# Patient Record
Sex: Male | Born: 1975 | Race: White | Hispanic: No | Marital: Married | Smoking: Never smoker
Health system: Southern US, Community
[De-identification: ages and names within clinical notes are randomized; demographics above are authoritative.]

## PROBLEM LIST (undated history)

## (undated) DIAGNOSIS — F909 Attention-deficit hyperactivity disorder, unspecified type: Secondary | ICD-10-CM

## (undated) HISTORY — PX: LAPAROSCOPIC INGUINAL HERNIA REPAIR: SUR788

---

## 2014-10-05 ENCOUNTER — Other Ambulatory Visit: Payer: Self-pay | Admitting: Neurology

## 2014-10-05 DIAGNOSIS — R200 Anesthesia of skin: Secondary | ICD-10-CM

## 2014-10-05 DIAGNOSIS — R202 Paresthesia of skin: Principal | ICD-10-CM

## 2014-10-09 ENCOUNTER — Ambulatory Visit
Admission: RE | Admit: 2014-10-09 | Discharge: 2014-10-09 | Disposition: A | Discharge status: Home / Self Care | Payer: 59 | Source: Ambulatory Visit | Attending: Neurology | Admitting: Neurology

## 2014-10-09 DIAGNOSIS — R200 Anesthesia of skin: Secondary | ICD-10-CM | POA: Insufficient documentation

## 2014-10-09 DIAGNOSIS — R202 Paresthesia of skin: Secondary | ICD-10-CM | POA: Diagnosis present

## 2014-10-09 MED ORDER — GADOBENATE DIMEGLUMINE 529 MG/ML IV SOLN
15.0000 mL | Freq: Once | INTRAVENOUS | Status: AC | PRN
  Administered 2014-10-09: 14 mL via INTRAVENOUS

## 2016-05-09 IMAGING — MR MR HEAD WO/W CM
12 of 13 series · 42 of 48 positions shown · IV contrast (multihance)
Comparison: None.

CLINICAL DATA: Head trauma 2 years ago. No surgery. LEFT-sided lip
numbness. Symptoms for 3 weeks.

EXAM:
MRI HEAD WITHOUT AND WITH CONTRAST
TECHNIQUE: Multiplanar, multiecho pulse sequences of the brain and surrounding
structures were obtained without and with intravenous contrast.
CONTRAST:  14mL MULTIHANCE GADOBENATE DIMEGLUMINE 529 MG/ML IV SOLN

[Series 2: T1 · sagittal · 5.0mm · 0.45mm/px · 2 of 28 slices shown]
[im 1/28]
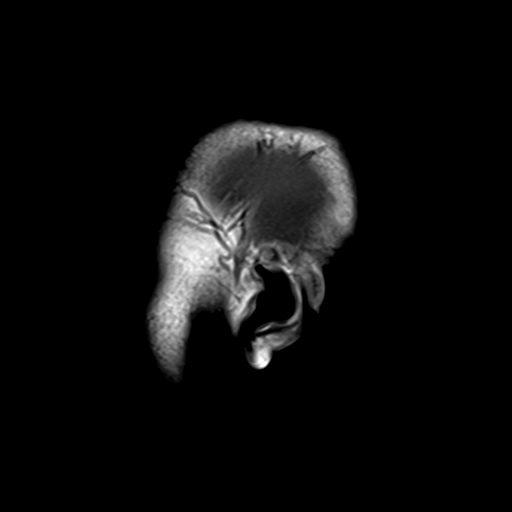
[im 14/28]
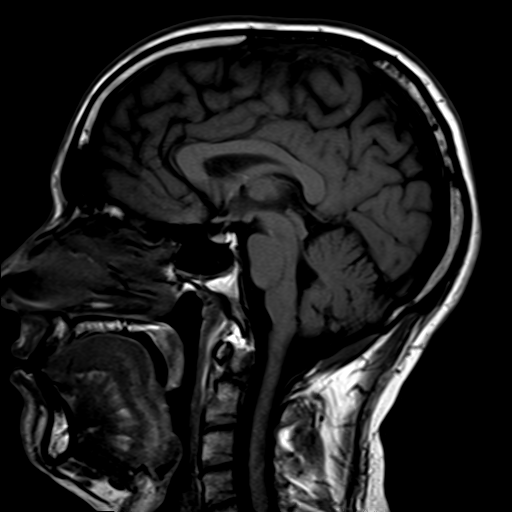

[Series 4: DWI · axial · 3.0mm · 1.80mm/px · z∈[-41,+120]mm · 5 of 56 slices shown (1 of 4)]
[im 1/56]
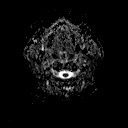
[im 14/56]
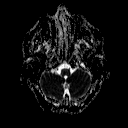
[im 28/56]
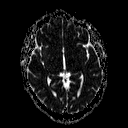
[im 42/56]
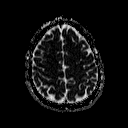
[im 56/56]
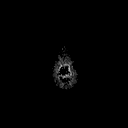

[Series 6: DWI · coronal · 3.0mm · 1.80mm/px · 4 of 47 slices shown (2 of 4)]
[im 1/47]
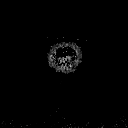
[im 16/47]
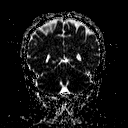
[im 31/47]
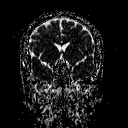
[im 47/47]
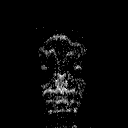

[Series 7: T2 · axial · 5.0mm · 0.60mm/px · z∈[-51,+117]mm · 3 of 27 slices shown (1 of 2)]
[im 1/27]
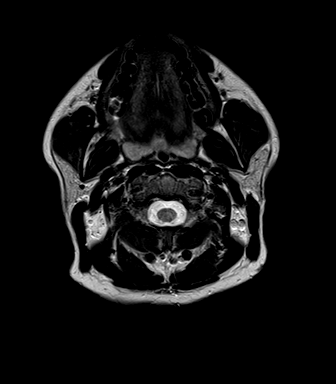
[im 14/27]
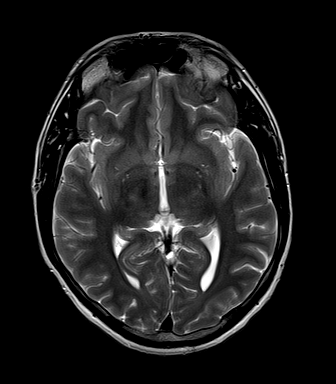
[im 27/27]
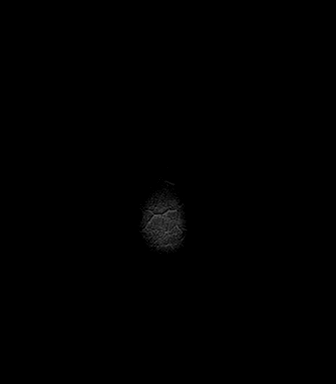

[Series 8: FLAIR · sagittal · 5.0mm · 0.43mm/px · 2 of 25 slices shown (1 of 2)]
[im 1/25]
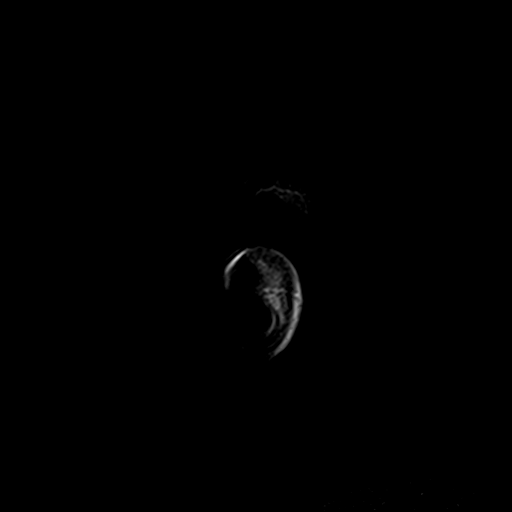
[im 25/25]
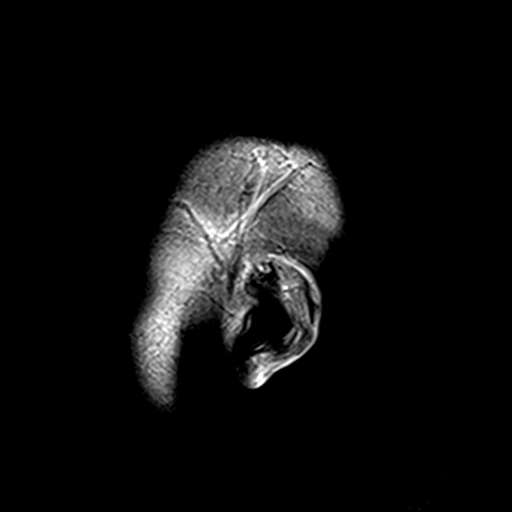

[Series 9: FLAIR · axial · 5.0mm · 0.45mm/px · z∈[-50,+117]mm · 3 of 27 slices shown (2 of 2)]
[im 1/27]
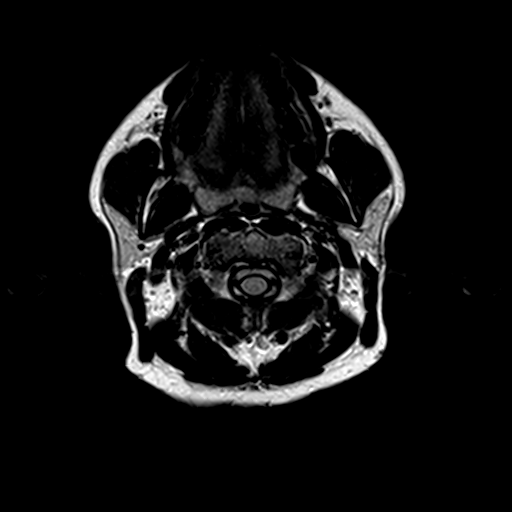
[im 14/27]
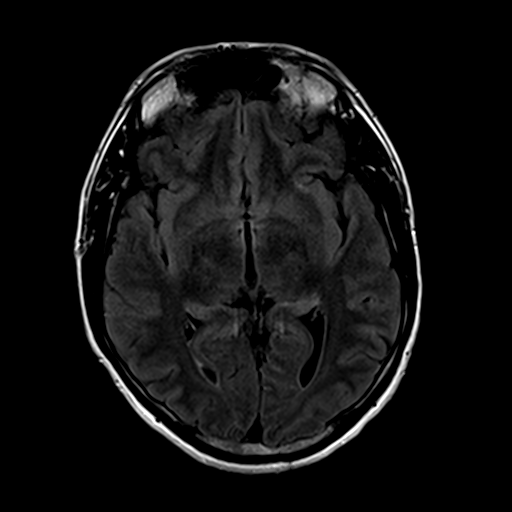
[im 27/27]
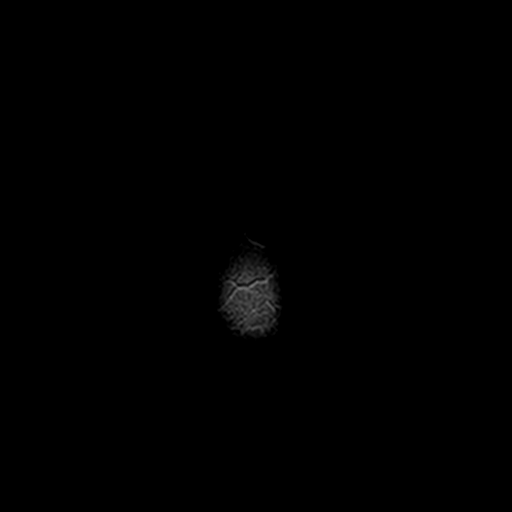

[Series 10: T2 · axial · 5.0mm · 0.45mm/px · z∈[-51,+117]mm · 3 of 27 slices shown (2 of 2)]
[im 1/27]
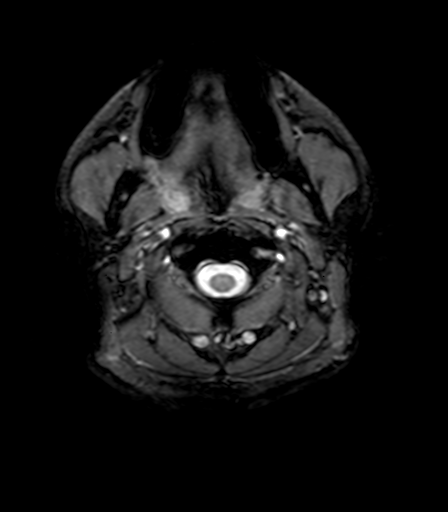
[im 14/27]
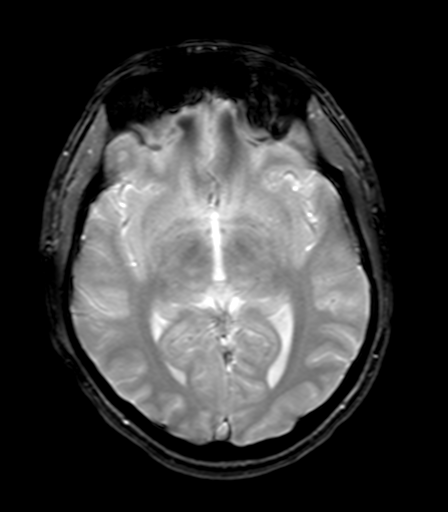
[im 27/27]
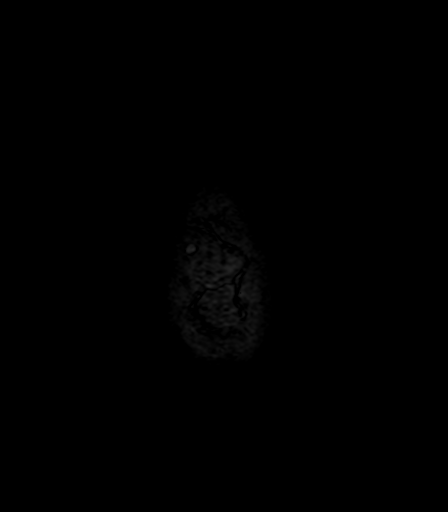

[Series 12: T2 post-contrast · coronal · 5.0mm · 0.49mm/px · 3 of 29 slices shown]
[im 1/29]
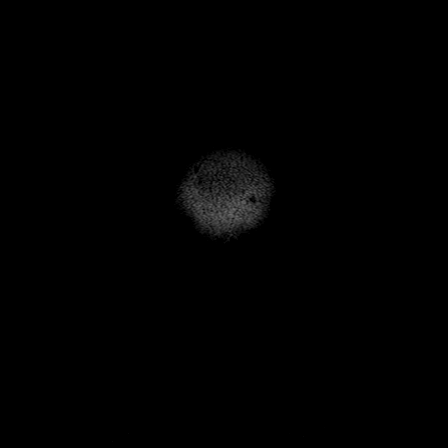
[im 15/29]
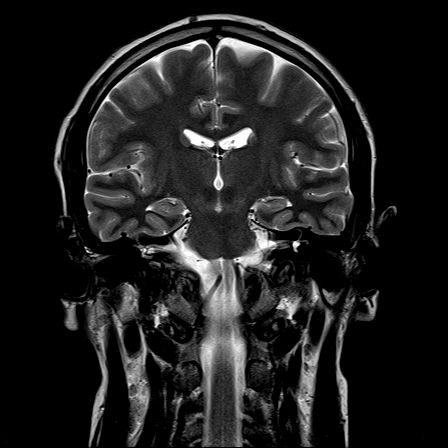
[im 29/29]
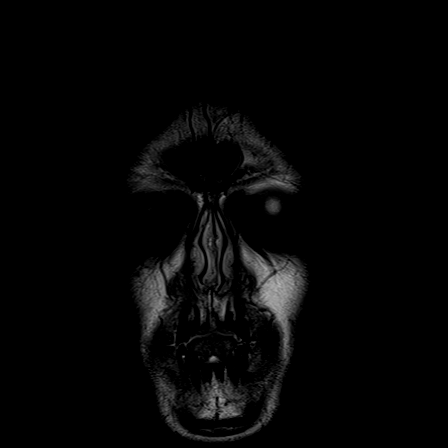

[Series 13: T1 post-contrast · axial · 3.0mm · 1.00mm/px · z∈[-47,+117]mm · 5 of 56 slices shown (1 of 2)]
[im 1/56]
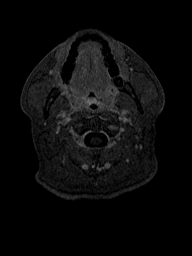
[im 14/56]
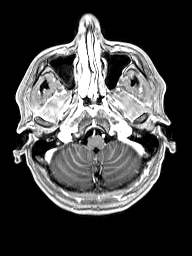
[im 28/56]
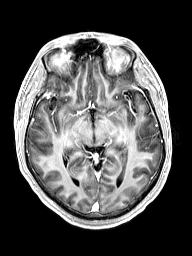
[im 42/56]
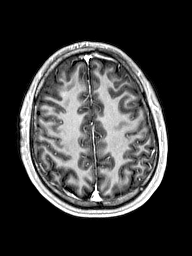
[im 56/56]
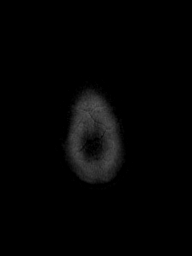

[Series 14: T1 post-contrast · coronal · 5.0mm · 0.43mm/px · 3 of 29 slices shown (2 of 2)]
[im 1/29]
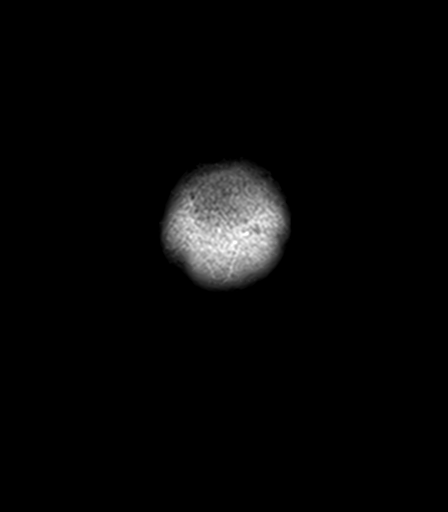
[im 15/29]
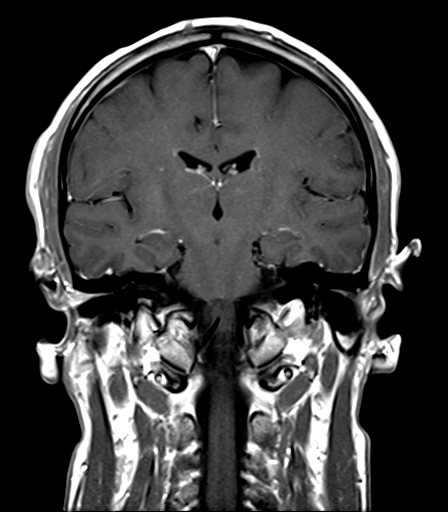
[im 29/29]
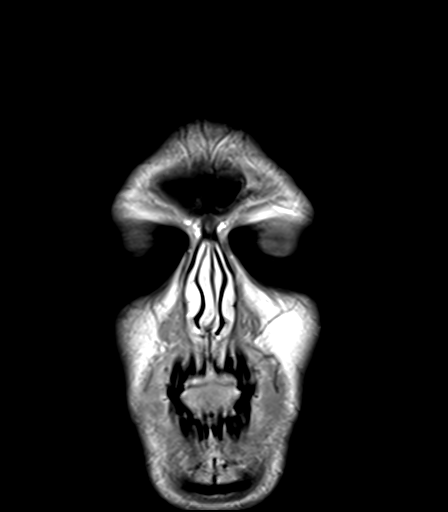

[Series 100: DWI · axial · 3.0mm · 1.80mm/px · z∈[-41,+120]mm · 5 of 56 slices shown (3 of 4)]
[im 1/56]
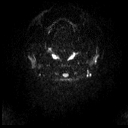
[im 14/56]
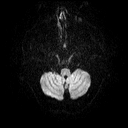
[im 28/56]
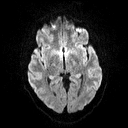
[im 42/56]
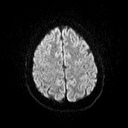
[im 56/56]
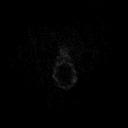

[Series 101: DWI · coronal · 3.0mm · 1.80mm/px · 4 of 47 slices shown (4 of 4)]
[im 1/47]
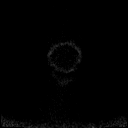
[im 16/47]
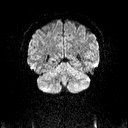
[im 31/47]
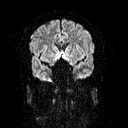
[im 47/47]
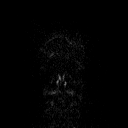

[42 of 48 positions shown; findings below may reference images not displayed]

FINDINGS: No evidence for acute infarction, hemorrhage, mass lesion,
hydrocephalus, or extra-axial fluid. Normal cerebral volume. No
significant white matter disease. Sagittal FLAIR images demonstrate
normal corpus callosum and periventricular regions.

Pituitary, pineal, and cerebellar tonsils unremarkable. No upper
cervical lesions.

Flow voids are maintained throughout the carotid, basilar, and
vertebral arteries. There are no areas of chronic hemorrhage.

Post infusion, no abnormal enhancement of the brain or meninges.

Visualized calvarium, skull base, and upper cervical osseous
structures unremarkable. Scalp and extracranial soft tissues,
orbits, sinuses, and mastoids show no acute process.
IMPRESSION: Negative exam.

## 2016-09-24 ENCOUNTER — Emergency Department

## 2016-09-24 ENCOUNTER — Encounter: Payer: Self-pay | Admitting: Emergency Medicine

## 2016-09-24 ENCOUNTER — Inpatient Hospital Stay
Admission: EM | Admit: 2016-09-24 | Discharge: 2016-09-26 | DRG: 871 | Disposition: A | Discharge status: Home / Self Care | Attending: Internal Medicine | Admitting: Internal Medicine

## 2016-09-24 DIAGNOSIS — E0781 Sick-euthyroid syndrome: Secondary | ICD-10-CM

## 2016-09-24 DIAGNOSIS — E663 Overweight: Secondary | ICD-10-CM | POA: Diagnosis present

## 2016-09-24 DIAGNOSIS — R531 Weakness: Secondary | ICD-10-CM | POA: Diagnosis not present

## 2016-09-24 DIAGNOSIS — A419 Sepsis, unspecified organism: Secondary | ICD-10-CM | POA: Diagnosis not present

## 2016-09-24 DIAGNOSIS — R739 Hyperglycemia, unspecified: Secondary | ICD-10-CM

## 2016-09-24 DIAGNOSIS — R0902 Hypoxemia: Secondary | ICD-10-CM | POA: Diagnosis present

## 2016-09-24 DIAGNOSIS — J441 Chronic obstructive pulmonary disease with (acute) exacerbation: Secondary | ICD-10-CM | POA: Diagnosis present

## 2016-09-24 DIAGNOSIS — J440 Chronic obstructive pulmonary disease with acute lower respiratory infection: Secondary | ICD-10-CM | POA: Diagnosis present

## 2016-09-24 DIAGNOSIS — F909 Attention-deficit hyperactivity disorder, unspecified type: Secondary | ICD-10-CM | POA: Diagnosis present

## 2016-09-24 DIAGNOSIS — Z79899 Other long term (current) drug therapy: Secondary | ICD-10-CM | POA: Diagnosis not present

## 2016-09-24 DIAGNOSIS — J189 Pneumonia, unspecified organism: Secondary | ICD-10-CM

## 2016-09-24 DIAGNOSIS — Z6826 Body mass index (BMI) 26.0-26.9, adult: Secondary | ICD-10-CM

## 2016-09-24 DIAGNOSIS — D72829 Elevated white blood cell count, unspecified: Secondary | ICD-10-CM

## 2016-09-24 DIAGNOSIS — R6510 Systemic inflammatory response syndrome (SIRS) of non-infectious origin without acute organ dysfunction: Secondary | ICD-10-CM

## 2016-09-24 DIAGNOSIS — J9601 Acute respiratory failure with hypoxia: Secondary | ICD-10-CM

## 2016-09-24 HISTORY — DX: Attention-deficit hyperactivity disorder, unspecified type: F90.9

## 2016-09-24 LAB — BASIC METABOLIC PANEL
Anion gap: 6 (ref 5–15)
BUN: 14 mg/dL (ref 6–20)
CHLORIDE: 108 mmol/L (ref 101–111)
CO2: 24 mmol/L (ref 22–32)
CREATININE: 0.95 mg/dL (ref 0.61–1.24)
Calcium: 8.9 mg/dL (ref 8.9–10.3)
GFR calc non Af Amer: >60 mL/min (ref >60)
Glucose, Bld: 114 mg/dL — ABNORMAL HIGH (ref 65–99)
POTASSIUM: 4 mmol/L (ref 3.5–5.1)
SODIUM: 138 mmol/L (ref 135–145)

## 2016-09-24 LAB — URINALYSIS, COMPLETE (UACMP) WITH MICROSCOPIC
Bilirubin Urine: NEGATIVE
Glucose, UA: NEGATIVE mg/dL
Hgb urine dipstick: NEGATIVE
KETONES UR: NEGATIVE mg/dL
LEUKOCYTES UA: NEGATIVE
Nitrite: NEGATIVE
PROTEIN: NEGATIVE mg/dL
SQUAMOUS EPITHELIAL / LPF: NONE SEEN
Specific Gravity, Urine: 1.013 (ref 1.005–1.030)
pH: 7 (ref 5.0–8.0)

## 2016-09-24 LAB — CK: Total CK: 105 U/L (ref 49–397)

## 2016-09-24 LAB — CBC
HCT: 37.8 % — ABNORMAL LOW (ref 40.0–52.0)
Hemoglobin: 13 g/dL (ref 13.0–18.0)
MCH: 29.4 pg (ref 26.0–34.0)
MCHC: 34.4 g/dL (ref 32.0–36.0)
MCV: 85.3 fL (ref 80.0–100.0)
Platelets: 221 K/uL (ref 150–440)
RBC: 4.43 MIL/uL (ref 4.40–5.90)
RDW: 13.4 % (ref 11.5–14.5)
WBC: 18.2 K/uL — ABNORMAL HIGH (ref 3.8–10.6)

## 2016-09-24 LAB — TROPONIN I

## 2016-09-24 LAB — GLUCOSE, CAPILLARY: Glucose-Capillary: 103 mg/dL — ABNORMAL HIGH (ref 65–99)

## 2016-09-24 LAB — LACTIC ACID, PLASMA: Lactic Acid, Venous: 1 mmol/L (ref 0.5–1.9)

## 2016-09-24 MED ORDER — SODIUM CHLORIDE 0.9 % IV BOLUS (SEPSIS)
1000.0000 mL | Freq: Once | INTRAVENOUS | Status: AC
  Administered 2016-09-24: 1000 mL via INTRAVENOUS

## 2016-09-24 MED ORDER — ACETAMINOPHEN 500 MG PO TABS
1000.0000 mg | ORAL_TABLET | Freq: Once | ORAL | Status: AC
  Administered 2016-09-24: 1000 mg via ORAL

## 2016-09-24 MED ORDER — DEXTROSE 5 % IV SOLN
500.0000 mg | Freq: Once | INTRAVENOUS | Status: AC
  Administered 2016-09-25: 500 mg via INTRAVENOUS
  Filled 2016-09-24: qty 500

## 2016-09-24 MED ORDER — DEXTROSE 5 % IV SOLN
1.0000 g | INTRAVENOUS | Status: DC
  Administered 2016-09-25: 1 g via INTRAVENOUS
  Filled 2016-09-24: qty 10

## 2016-09-24 MED ORDER — ACETAMINOPHEN 500 MG PO TABS
ORAL_TABLET | ORAL | Status: AC
  Filled 2016-09-24: qty 2

## 2016-09-24 NOTE — ED Triage Notes (Addendum)
Pt to triage via wheelchair, reports weakness, fatigue, dizziness, oliguria, polydipsia, generalized pain, chills, and cough since last night.  Low grade temp noted in triage.

## 2016-09-24 NOTE — ED Provider Notes (Signed)
[facility] Emergency Department Provider Note   ____________________________________________   First MD Initiated Contact with Patient 09/24/16 2315     (approximate)  I have reviewed the triage vital signs and the nursing notes.   HISTORY  Chief Complaint Weakness and Generalized Body Aches    HPI Arvell Pulsifer is a 41 y.o. male who presents to the ED from home with a chief complaint of generalized weakness, fatigue, cough, chills, dizziness. Patient reports generalized weakness and fatigue for the past several months. Sometimes associated with polydipsia and oliguria. Since last evening, patient has had chills, subjective fever and nonproductive cough.Denies associated abdominal pain, nausea, vomiting, dysuria or diarrhea. Denies recent travel or trauma. Nothing makes his symptoms better or worse. Has not taken anything prior to arrival.   Past medical history None  There are no active problems to display for this patient.   History reviewed. No pertinent surgical history.  Prior to Admission medications   Medication Sig Start Date End Date Taking? Authorizing Provider  amphetamine-dextroamphetamine (ADDERALL XR) 20 MG 24 hr capsule Take 1 capsule by mouth every morning. 09/14/16  Yes [provider]  amphetamine-dextroamphetamine (ADDERALL) 10 MG tablet Take 1 tablet by mouth daily. In the afternoon 09/14/16  Yes [provider]  Multiple Vitamin (MULTI-VITAMINS) TABS Take 1 tablet by mouth daily.   Yes [provider]  Omega-3 1000 MG CAPS Take 1 capsule by mouth daily.   Yes [provider]    Allergies Patient has no known allergies.  History reviewed. No pertinent family history.  Social History Social History  Substance Use Topics  . Smoking status: Never Smoker  . Smokeless tobacco: Never Used  . Alcohol use Yes    Review of Systems  Constitutional: Positive for fever/chills. Positive for  generalized weakness and myalgias. Eyes: No visual changes. ENT: No sore throat. Cardiovascular: Denies chest pain. Respiratory: Positive for nonproductive cough. Denies shortness of breath. Gastrointestinal: No abdominal pain.  No nausea, no vomiting.  No diarrhea.  No constipation. Genitourinary: Negative for dysuria. Musculoskeletal: Negative for back pain. Skin: Negative for rash. Neurological: Negative for headaches, focal weakness or numbness.   ____________________________________________   PHYSICAL EXAM:  VITAL SIGNS: ED Triage Vitals  Enc Vitals Group     BP 09/24/16 2216 (!) 102/59     Pulse Rate 09/24/16 2216 (!) 106     Resp 09/24/16 2216 16     Temp 09/24/16 2216 100 F (37.8 C)     Temp Source 09/24/16 2216 Oral     SpO2 09/24/16 2216 94 %     Weight 09/24/16 2216 150 lb (68 kg)     Height 09/24/16 2216 5\' 5"  (1.651 m)     Head Circumference --      Peak Flow --      Pain Score 09/24/16 2215 7     Pain Loc --      Pain Edu? --      Excl. in GC? --     Constitutional: Alert and oriented. Well appearing and in mild acute distress. Eyes: Conjunctivae are normal. PERRL. EOMI. Head: Atraumatic. Nose: Congestion/rhinnorhea. Mouth/Throat: Mucous membranes are moist.  Oropharynx non-erythematous. Neck: No stridor.  Supple neck without meningismus.  No carotid bruits. Hematological/Lymphatic/Immunilogical: No cervical lymphadenopathy. Cardiovascular: Normal rate, regular rhythm. Grossly normal heart sounds.  Good peripheral circulation. Respiratory: Normal respiratory effort.  No retractions. Lungs with bibasilar rales. Gastrointestinal: Soft and nontender. No distention. No abdominal bruits. No CVA tenderness. Musculoskeletal:  No lower extremity tenderness nor edema.  No joint effusions. Neurologic:  Normal speech and language. No gross focal neurologic deficits are appreciated.  Skin:  Skin is warm, dry and intact. No rash noted. No petechiae. Psychiatric:  Mood and affect are normal. Speech and behavior are normal.  ____________________________________________   LABS (all labs ordered are listed, but only abnormal results are displayed)  Labs Reviewed  BASIC METABOLIC PANEL - Abnormal; Notable for the following:       Result Value   Glucose, Bld 114 (*)    All other components within normal limits  CBC - Abnormal; Notable for the following:    WBC 18.2 (*)    HCT 37.8 (*)    All other components within normal limits  URINALYSIS, COMPLETE (UACMP) WITH MICROSCOPIC - Abnormal; Notable for the following:    Color, Urine YELLOW (*)    APPearance CLEAR (*)    Bacteria, UA RARE (*)    All other components within normal limits  GLUCOSE, CAPILLARY - Abnormal; Notable for the following:    Glucose-Capillary 103 (*)    All other components within normal limits  T4, FREE - Abnormal; Notable for the following:    Free T4 0.59 (*)    All other components within normal limits  CULTURE, BLOOD (ROUTINE X 2)  CULTURE, BLOOD (ROUTINE X 2)  TROPONIN I  LACTIC ACID, PLASMA  CK  TSH  MONONUCLEOSIS SCREEN  LACTIC ACID, PLASMA  CBG MONITORING, ED   ____________________________________________  EKG  ED ECG REPORT I, Gianluca Chhim J, the attending physician, personally viewed and interpreted this ECG.   Date: 09/24/2016  EKG Time: 2215  Rate: 95  Rhythm: normal EKG, normal sinus rhythm  Axis: Normal  Intervals:none  ST&T Change: Nonspecific  ____________________________________________  RADIOLOGY  Dg Chest Port 1 View  Result Date: 09/24/2016 CLINICAL DATA:  Cough and chills. EXAM: PORTABLE CHEST 1 VIEW COMPARISON:  None. FINDINGS: Low lung volumes. There are patchy bibasilar opacities, left greater than right. Heart size is normal for technique. Questionable blunting of right costophrenic angle. No pneumothorax. No acute osseous abnormality is seen. IMPRESSION: Low lung volumes. Patchy bibasilar opacities, left greater than right, may be  pneumonia or atelectasis. Aspiration could have this appearance in the appropriate clinical setting. Electronically Signed   By: Rubye OaksMelanie  Ehinger M.D.   On: 09/24/2016 23:42    ____________________________________________   PROCEDURES  Procedure(s) performed: None  Procedures  Critical Care performed: No  ____________________________________________   INITIAL IMPRESSION / ASSESSMENT AND PLAN / ED COURSE  Pertinent labs & imaging results that were available during my care of the patient were reviewed by me and considered in my medical decision making (see chart for details).  41 year old male who presents with generalized weakness, fever, chills and cough. Laboratory and imaging results demonstrate leukocytosis with by lateral community-acquired pneumonia. Lactate is within normal limits. Will initiate IV fluid resuscitation, IV antibiotics. Check mono screen and thyroid panel. Will reassess.  Clinical Course as of Sep 26 215  Fri Sep 25, 2016  0153 Room air sats 89-90%, placed on 2 L nasal cannula oxygen with sats now 90-93%. Still weak appearing. IV antibiotics infusing. Will discuss with hospitalist to evaluate patient in the emergency department for admission.  [JS]    Clinical Course User Index [JS] Irean HongSung, Devonna Oboyle J, MD     ____________________________________________   FINAL CLINICAL IMPRESSION(S) / ED DIAGNOSES  Final diagnoses:  Generalized weakness  Community acquired pneumonia, unspecified laterality  Hypoxia  SIRS (  systemic inflammatory response syndrome) (HCC)      NEW MEDICATIONS STARTED DURING THIS VISIT:  New Prescriptions   No medications on file     Note:  This document was prepared using Dragon voice recognition software and may include unintentional dictation errors.    Irean Hong, MD 09/25/16 [phone]

## 2016-09-25 DIAGNOSIS — R531 Weakness: Secondary | ICD-10-CM | POA: Diagnosis present

## 2016-09-25 DIAGNOSIS — E0781 Sick-euthyroid syndrome: Secondary | ICD-10-CM | POA: Diagnosis present

## 2016-09-25 DIAGNOSIS — R739 Hyperglycemia, unspecified: Secondary | ICD-10-CM | POA: Diagnosis present

## 2016-09-25 DIAGNOSIS — F909 Attention-deficit hyperactivity disorder, unspecified type: Secondary | ICD-10-CM | POA: Diagnosis present

## 2016-09-25 DIAGNOSIS — A419 Sepsis, unspecified organism: Secondary | ICD-10-CM | POA: Diagnosis present

## 2016-09-25 DIAGNOSIS — E663 Overweight: Secondary | ICD-10-CM | POA: Diagnosis present

## 2016-09-25 DIAGNOSIS — Z79899 Other long term (current) drug therapy: Secondary | ICD-10-CM | POA: Diagnosis not present

## 2016-09-25 DIAGNOSIS — J9601 Acute respiratory failure with hypoxia: Secondary | ICD-10-CM | POA: Diagnosis present

## 2016-09-25 DIAGNOSIS — J441 Chronic obstructive pulmonary disease with (acute) exacerbation: Secondary | ICD-10-CM | POA: Diagnosis present

## 2016-09-25 DIAGNOSIS — Z6826 Body mass index (BMI) 26.0-26.9, adult: Secondary | ICD-10-CM | POA: Diagnosis not present

## 2016-09-25 DIAGNOSIS — R0902 Hypoxemia: Secondary | ICD-10-CM | POA: Diagnosis present

## 2016-09-25 DIAGNOSIS — J440 Chronic obstructive pulmonary disease with acute lower respiratory infection: Secondary | ICD-10-CM | POA: Diagnosis present

## 2016-09-25 DIAGNOSIS — J189 Pneumonia, unspecified organism: Secondary | ICD-10-CM | POA: Diagnosis present

## 2016-09-25 LAB — T4, FREE: Free T4: 0.59 ng/dL — ABNORMAL LOW (ref 0.61–1.12)

## 2016-09-25 LAB — TSH: TSH: 3.546 u[IU]/mL (ref 0.350–4.500)

## 2016-09-25 LAB — HEMOGLOBIN A1C
Hgb A1c MFr Bld: 5.3 % (ref 4.8–5.6)
Mean Plasma Glucose: 105.41 mg/dL

## 2016-09-25 LAB — LACTIC ACID, PLASMA: Lactic Acid, Venous: 1.1 mmol/L (ref 0.5–1.9)

## 2016-09-25 LAB — MONONUCLEOSIS SCREEN: MONO SCREEN: NEGATIVE

## 2016-09-25 MED ORDER — OMEGA-3-ACID ETHYL ESTERS 1 G PO CAPS
1.0000 g | ORAL_CAPSULE | Freq: Every day | ORAL | Status: DC
  Administered 2016-09-25 – 2016-09-26 (×2): 1 g via ORAL
  Filled 2016-09-25 (×2): qty 1

## 2016-09-25 MED ORDER — SODIUM CHLORIDE 0.9 % IV SOLN
INTRAVENOUS | Status: DC
  Administered 2016-09-25 – 2016-09-26 (×3): via INTRAVENOUS

## 2016-09-25 MED ORDER — ONDANSETRON HCL 4 MG PO TABS: 4.0000 mg | ORAL_TABLET | Freq: Four times a day (QID) | ORAL | Status: DC | PRN

## 2016-09-25 MED ORDER — ACETAMINOPHEN 650 MG RE SUPP: 650.0000 mg | Freq: Four times a day (QID) | RECTAL | Status: DC | PRN

## 2016-09-25 MED ORDER — AZITHROMYCIN 500 MG IV SOLR
500.0000 mg | INTRAVENOUS | Status: DC
  Administered 2016-09-25: 500 mg via INTRAVENOUS
  Filled 2016-09-25 (×2): qty 500

## 2016-09-25 MED ORDER — HYDROCOD POLST-CPM POLST ER 10-8 MG/5ML PO SUER
5.0000 mL | Freq: Two times a day (BID) | ORAL | Status: DC
  Administered 2016-09-25 – 2016-09-26 (×3): 5 mL via ORAL
  Filled 2016-09-25 (×3): qty 5

## 2016-09-25 MED ORDER — ENOXAPARIN SODIUM 40 MG/0.4ML ~~LOC~~ SOLN
40.0000 mg | SUBCUTANEOUS | Status: DC
  Administered 2016-09-25 – 2016-09-26 (×2): 40 mg via SUBCUTANEOUS
  Filled 2016-09-25 (×2): qty 0.4

## 2016-09-25 MED ORDER — ONDANSETRON HCL 4 MG/2ML IJ SOLN: 4.0000 mg | Freq: Four times a day (QID) | INTRAMUSCULAR | Status: DC | PRN

## 2016-09-25 MED ORDER — IPRATROPIUM-ALBUTEROL 0.5-2.5 (3) MG/3ML IN SOLN
3.0000 mL | RESPIRATORY_TRACT | Status: DC
  Administered 2016-09-25 – 2016-09-26 (×8): 3 mL via RESPIRATORY_TRACT
  Filled 2016-09-25 (×8): qty 3

## 2016-09-25 MED ORDER — ADULT MULTIVITAMIN W/MINERALS CH
1.0000 | ORAL_TABLET | Freq: Every day | ORAL | Status: DC
  Administered 2016-09-25 – 2016-09-26 (×2): 1 via ORAL
  Filled 2016-09-25 (×2): qty 1

## 2016-09-25 MED ORDER — DEXTROSE 5 % IV SOLN
1.0000 g | INTRAVENOUS | Status: DC
  Administered 2016-09-25: 1 g via INTRAVENOUS
  Filled 2016-09-25 (×2): qty 10

## 2016-09-25 MED ORDER — AMPHETAMINE-DEXTROAMPHET ER 5 MG PO CP24
20.0000 mg | ORAL_CAPSULE | ORAL | Status: DC
  Filled 2016-09-25 (×2): qty 4

## 2016-09-25 MED ORDER — GUAIFENESIN ER 600 MG PO TB12
600.0000 mg | ORAL_TABLET | Freq: Two times a day (BID) | ORAL | Status: DC
  Administered 2016-09-25 – 2016-09-26 (×3): 600 mg via ORAL
  Filled 2016-09-25 (×3): qty 1

## 2016-09-25 MED ORDER — CEFTRIAXONE SODIUM 1 G IJ SOLR
1.0000 g | INTRAMUSCULAR | Status: DC
  Filled 2016-09-25: qty 10

## 2016-09-25 MED ORDER — PREDNISONE 50 MG PO TABS
50.0000 mg | ORAL_TABLET | Freq: Every day | ORAL | Status: DC
  Administered 2016-09-25 – 2016-09-26 (×2): 50 mg via ORAL
  Filled 2016-09-25 (×2): qty 1

## 2016-09-25 MED ORDER — AMPHETAMINE-DEXTROAMPHETAMINE 5 MG PO TABS: 10.0000 mg | ORAL_TABLET | Freq: Every day | ORAL | Status: DC

## 2016-09-25 MED ORDER — OMEGA-3 1000 MG PO CAPS: 1.0000 | ORAL_CAPSULE | Freq: Every day | ORAL | Status: DC

## 2016-09-25 MED ORDER — ACETAMINOPHEN 325 MG PO TABS: 650.0000 mg | ORAL_TABLET | Freq: Four times a day (QID) | ORAL | Status: DC | PRN

## 2016-09-25 MED ORDER — DOCUSATE SODIUM 100 MG PO CAPS
100.0000 mg | ORAL_CAPSULE | Freq: Two times a day (BID) | ORAL | Status: DC
  Administered 2016-09-25 – 2016-09-26 (×3): 100 mg via ORAL
  Filled 2016-09-25 (×3): qty 1

## 2016-09-25 NOTE — ED Notes (Signed)
Pt's o2 dropped to 84%, pt placed on 3L via nasal canula.

## 2016-09-25 NOTE — Progress Notes (Signed)
Patient ID: Frederick Smith, male   DOB: [birth year], 41 y.o.   MRN: [id number]   [facility] [place] [facility]   PATIENT NAME: Frederick Smith    MR#:  [id number]  DATE OF BIRTH:  28-Oct-1975  SUBJECTIVE:  CHIEF COMPLAINT:   Chief Complaint  Patient presents with  . Weakness  . Generalized Body Aches   Patient is 41 year old Caucasian male with a history significant for history of ADHD, who presented to the hospital with complaints of weakness, fatigue, shortness of breath. He was noted to be hypoxic, was initiated on oxygen through nasal cannula, felt much better. It is of cough which is occasionally productive. His chest x-ray revealed patchy bibasilar opacities, left greater than right, concerning for pneumonia. Patient was admitted to the hospital for further evaluation and treatment. He was initiated on Rocephin and Zithromax and feels somewhat better now. Review of Systems  Constitutional: Negative for chills, fever and weight loss.  HENT: Negative for congestion.   Eyes: Negative for blurred vision and double vision.  Respiratory: Positive for cough, sputum production and shortness of breath. Negative for wheezing.   Cardiovascular: Negative for chest pain, palpitations, orthopnea, leg swelling and PND.  Gastrointestinal: Negative for abdominal pain, blood in stool, constipation, diarrhea, nausea and vomiting.  Genitourinary: Negative for dysuria, frequency, hematuria and urgency.  Musculoskeletal: Negative for falls.  Neurological: Positive for weakness. Negative for dizziness, tremors, focal weakness and headaches.  Endo/Heme/Allergies: Does not bruise/bleed easily.  Psychiatric/Behavioral: Negative for depression. The patient does not have insomnia.     VITAL SIGNS: Blood pressure 94/62, pulse 73, temperature 98.6 F (37 C), temperature source Oral, resp. rate 14, height 5\' 5"  (1.651 m), weight 73.1 kg (161 lb 3.2 oz), SpO2 96 %.  PHYSICAL  EXAMINATION:   GENERAL:  41 y.o.-year-old patient lying in the bed with no acute distress.  EYES: Pupils equal, round, reactive to light and accommodation. No scleral icterus. Extraocular muscles intact.  HEENT: Head atraumatic, normocephalic. Oropharynx and nasopharynx clear.  NECK:  Supple, no jugular venous distention. No thyroid enlargement, no tenderness.  LUNGS: Normal breath sounds bilaterally, no wheezing, few scattered rales,rhonchi and crepitations bilateral lung bases. Using  accessory muscles of respiration on exertion, and speech.  CARDIOVASCULAR: S1, S2 normal. No murmurs, rubs, or gallops.  ABDOMEN: Soft, nontender, nondistended. Bowel sounds present. No organomegaly or mass.  EXTREMITIES: No pedal edema, cyanosis, or clubbing.  NEUROLOGIC: Cranial nerves II through XII are intact. Muscle strength 5/5 in all extremities. Sensation intact. Gait not checked.  PSYCHIATRIC: The patient is alert and oriented x 3.  SKIN: No obvious rash, lesion, or ulcer.   ORDERS/RESULTS REVIEWED:   CBC  Recent Labs Lab 09/24/16 2222  WBC 18.2*  HGB 13.0  HCT 37.8*  PLT 221  MCV 85.3  MCH 29.4  MCHC 34.4  RDW 13.4   ------------------------------------------------------------------------------------------------------------------  Chemistries   Recent Labs Lab 09/24/16 2222  NA 138  K 4.0  CL 108  CO2 24  GLUCOSE 114*  BUN 14  CREATININE 0.95  CALCIUM 8.9   ------------------------------------------------------------------------------------------------------------------ estimated creatinine clearance is 89.9 mL/min (by C-G formula based on SCr of 0.95 mg/dL). ------------------------------------------------------------------------------------------------------------------  Recent Labs  09/25/16 0006  TSH 3.546    Cardiac Enzymes  Recent Labs Lab 09/24/16 2222  TROPONINI <0.03    ------------------------------------------------------------------------------------------------------------------ Invalid input(s): POCBNP ---------------------------------------------------------------------------------------------------------------  RADIOLOGY: Dg Chest Port 1 View  Result Date: 09/24/2016 CLINICAL DATA:  Cough and chills. EXAM: PORTABLE CHEST 1 VIEW COMPARISON:  None. FINDINGS: Low lung volumes. There are patchy bibasilar opacities, left greater than right. Heart size is normal for technique. Questionable blunting of right costophrenic angle. No pneumothorax. No acute osseous abnormality is seen. IMPRESSION: Low lung volumes. Patchy bibasilar opacities, left greater than right, may be pneumonia or atelectasis. Aspiration could have this appearance in the appropriate clinical setting. Electronically Signed   By: Rubye Oaks M.D.   On: 09/24/2016 23:42    EKG:  Orders placed or performed during the hospital encounter of 09/24/16  . ED EKG  . ED EKG  . EKG 12-Lead  . EKG 12-Lead    ASSESSMENT AND PLAN:  Active Problems:   Sepsis (HCC)  #1. Sepsis due to pneumonia, lactic acid level was normal, blood cultures are negative so far, continue broad-spectrum antibiotic therapy, following closely, continue IV fluids #2. Bilateral basilar pneumonia, continue Rocephin and Zithromax, get sputum cultures if possible, blood cultures are negative so far #3. Generalized weakness, follow closely, get physical therapy evaluation if needed #4. Acute respiratory failure with hypoxia, wean off oxygen as tolerated #5. Leukocytosis, follow with therapy #6. Hyperglycemia, hemoglobin A1c 5.3, no diabetes #7. Abnormal thyroid function test, likely sick euthyroid   Management plans discussed with the patient, family and they are in agreement.   DRUG ALLERGIES: No Known Allergies  CODE STATUS:     Code Status Orders        Start     Ordered   09/25/16 0338  Full code   Continuous     09/25/16 0338    Code Status History    Date Active Date Inactive Code Status Order ID Comments User Context   This patient has a current code status but no historical code status.      TOTAL TIME TAKING CARE OF THIS PATIENT: 35  minutes.    Katharina Caper M.D on 09/25/2016 at 12:14 PM  Between 7am to 6pm - Pager - [phone]  After 6pm go to [email or web address] - password [email or web address]  [facility]  Office  [phone]  CC: Primary care physician; Claretta Fraise, DO

## 2016-09-25 NOTE — ED Notes (Signed)
Pt's oxygen saturation 98%, o2 turned off, pt now on room air.

## 2016-09-25 NOTE — Plan of Care (Signed)
Problem: Education: Goal: Knowledge of [facility] Education information/materials will improve Outcome: Progressing Pt likes to be called Frederick Smith Most  Pt has no medical history  Pt is well controlled with home medications

## 2016-09-25 NOTE — H&P (Signed)
Frederick Smith is an 41 y.o. male.   Chief Complaint: weakness HPI: the patient with past medical history of ADHD presents to the emergency department complaining of weakness. He states that he has been unable to pack his luggage for a trip out of town due to extreme fatigue and shortness of breath. He says that during his efforts to pack he would need to lay down and sleep for approximately 45 minutes.Once oxygen was placed on his face via nasal cannula patient reports that she felt much more comfortable.he reports feeling extremely fatigued for at least 2 weeks now. During that time he has also developed a cough which is occasionally productive. When the patient elaborates on his symptoms he recalls that he had to take a physical fitness test in May that is per routine for him but that he failed that test due to extreme fatigue. The patient reports chills and shortness of breath. Chest x-ray revealed bibasilar opacities which prompted the emergency department staff to call hospitalist service for admission.  Past Medical History:  Diagnosis Date  . ADHD     Past Surgical History:  Procedure Laterality Date  . LAPAROSCOPIC INGUINAL HERNIA REPAIR Bilateral     Family History  Problem Relation Age of Onset  . Heart disease Maternal Grandmother    Social History:  reports that he has never smoked. He has never used smokeless tobacco. He reports that he drinks alcohol. His drug history is not on file.  Allergies: No Known Allergies  Medications Prior to Admission  Medication Sig Dispense Refill  . amphetamine-dextroamphetamine (ADDERALL XR) 20 MG 24 hr capsule Take 1 capsule by mouth every morning.  0  . amphetamine-dextroamphetamine (ADDERALL) 10 MG tablet Take 1 tablet by mouth daily. In the afternoon  0  . Multiple Vitamin (MULTI-VITAMINS) TABS Take 1 tablet by mouth daily.    . Omega-3 1000 MG CAPS Take 1 capsule by mouth daily.      Results for orders placed or performed during the  hospital encounter of 09/24/16 (from the past 48 hour(s))  Glucose, capillary     Status: Abnormal   Collection Time: 09/24/16 10:21 PM  Result Value Ref Range   Glucose-Capillary 103 (H) 65 - 99 mg/dL  Basic metabolic panel     Status: Abnormal   Collection Time: 09/24/16 10:22 PM  Result Value Ref Range   Sodium 138 135 - 145 mmol/L   Potassium 4.0 3.5 - 5.1 mmol/L   Chloride 108 101 - 111 mmol/L   CO2 24 22 - 32 mmol/L   Glucose, Bld 114 (H) 65 - 99 mg/dL   BUN 14 6 - 20 mg/dL   Creatinine, Ser 0.95 0.61 - 1.24 mg/dL   Calcium 8.9 8.9 - 10.3 mg/dL   GFR calc non Af Amer >60 >60 mL/min   GFR calc Af Amer >60 >60 mL/min    Comment: (NOTE) The eGFR has been calculated using the CKD EPI equation. This calculation has not been validated in all clinical situations. eGFR's persistently <60 mL/min signify possible Chronic Kidney Disease.    Anion gap 6 5 - 15  CBC     Status: Abnormal   Collection Time: 09/24/16 10:22 PM  Result Value Ref Range   WBC 18.2 (H) 3.8 - 10.6 K/uL   RBC 4.43 4.40 - 5.90 MIL/uL   Hemoglobin 13.0 13.0 - 18.0 g/dL   HCT 37.8 (L) 40.0 - 52.0 %   MCV 85.3 80.0 - 100.0 fL   MCH 29.4  26.0 - 34.0 pg   MCHC 34.4 32.0 - 36.0 g/dL   RDW 13.4 11.5 - 14.5 %   Platelets 221 150 - 440 K/uL  Troponin I     Status: None   Collection Time: 09/24/16 10:22 PM  Result Value Ref Range   Troponin I <0.03 <0.03 ng/mL  CK     Status: None   Collection Time: 09/24/16 10:22 PM  Result Value Ref Range   Total CK 105 49 - 397 U/L  Urinalysis, Complete w Microscopic     Status: Abnormal   Collection Time: 09/24/16 11:04 PM  Result Value Ref Range   Color, Urine YELLOW (A) YELLOW   APPearance CLEAR (A) CLEAR   Specific Gravity, Urine 1.013 1.005 - 1.030   pH 7.0 5.0 - 8.0   Glucose, UA NEGATIVE NEGATIVE mg/dL   Hgb urine dipstick NEGATIVE NEGATIVE   Bilirubin Urine NEGATIVE NEGATIVE   Ketones, ur NEGATIVE NEGATIVE mg/dL   Protein, ur NEGATIVE NEGATIVE mg/dL   Nitrite  NEGATIVE NEGATIVE   Leukocytes, UA NEGATIVE NEGATIVE   RBC / HPF 0-5 0 - 5 RBC/hpf   WBC, UA 0-5 0 - 5 WBC/hpf   Bacteria, UA RARE (A) NONE SEEN   Squamous Epithelial / LPF NONE SEEN NONE SEEN  Lactic acid, plasma     Status: None   Collection Time: 09/24/16 11:05 PM  Result Value Ref Range   Lactic Acid, Venous 1.0 0.5 - 1.9 mmol/L  Culture, blood (routine x 2)     Status: None (Preliminary result)   Collection Time: 09/24/16 11:05 PM  Result Value Ref Range   Specimen Description BLOOD RIGHT ANTECUBITAL    Special Requests      BOTTLES DRAWN AEROBIC AND ANAEROBIC Blood Culture adequate volume   Culture NO GROWTH < 12 HOURS    Report Status PENDING   Culture, blood (routine x 2)     Status: None (Preliminary result)   Collection Time: 09/24/16 11:05 PM  Result Value Ref Range   Specimen Description BLOOD LEFT ANTECUBITAL    Special Requests      BOTTLES DRAWN AEROBIC AND ANAEROBIC Blood Culture adequate volume   Culture NO GROWTH < 12 HOURS    Report Status PENDING   TSH     Status: None   Collection Time: 09/25/16 12:06 AM  Result Value Ref Range   TSH 3.546 0.350 - 4.500 uIU/mL    Comment: Performed by a 3rd Generation assay with a functional sensitivity of <=0.01 uIU/mL.  T4, free     Status: Abnormal   Collection Time: 09/25/16 12:06 AM  Result Value Ref Range   Free T4 0.59 (L) 0.61 - 1.12 ng/dL    Comment: (NOTE) Biotin ingestion may interfere with free T4 tests. If the results are inconsistent with the TSH level, previous test results, or the clinical presentation, then consider biotin interference. If needed, order repeat testing after stopping biotin.   Mononucleosis screen     Status: None   Collection Time: 09/25/16 12:06 AM  Result Value Ref Range   Mono Screen NEGATIVE NEGATIVE  Lactic acid, plasma     Status: None   Collection Time: 09/25/16  3:47 AM  Result Value Ref Range   Lactic Acid, Venous 1.1 0.5 - 1.9 mmol/L   Dg Chest Port 1 View  Result  Date: 09/24/2016 CLINICAL DATA:  Cough and chills. EXAM: PORTABLE CHEST 1 VIEW COMPARISON:  None. FINDINGS: Low lung volumes. There are patchy bibasilar opacities, left  greater than right. Heart size is normal for technique. Questionable blunting of right costophrenic angle. No pneumothorax. No acute osseous abnormality is seen. IMPRESSION: Low lung volumes. Patchy bibasilar opacities, left greater than right, may be pneumonia or atelectasis. Aspiration could have this appearance in the appropriate clinical setting. Electronically Signed   By: Jeb Levering M.D.   On: 09/24/2016 23:42    Review of Systems  Constitutional: Positive for chills and malaise/fatigue. Negative for fever.  HENT: Negative for sore throat and tinnitus.   Eyes: Negative for blurred vision and redness.  Respiratory: Positive for shortness of breath. Negative for cough.   Cardiovascular: Negative for chest pain, palpitations, orthopnea and PND.  Gastrointestinal: Negative for abdominal pain, diarrhea, nausea and vomiting.  Genitourinary: Negative for dysuria, frequency and urgency.  Musculoskeletal: Negative for joint pain and myalgias.  Skin: Negative for rash.       No lesions  Neurological: Positive for weakness. Negative for speech change and focal weakness.  Endo/Heme/Allergies: Does not bruise/bleed easily.       No temperature intolerance  Psychiatric/Behavioral: Negative for depression and suicidal ideas.    Blood pressure 94/62, pulse 73, temperature 98.6 F (37 C), temperature source Oral, resp. rate 14, height 5' 5"  (1.651 m), weight 73.1 kg (161 lb 3.2 oz), SpO2 100 %. Physical Exam  Vitals reviewed. Constitutional: He is oriented to person, place, and time. He appears well-developed and well-nourished. No distress.  HENT:  Head: Normocephalic and atraumatic.  Mouth/Throat: Oropharynx is clear and moist.  Eyes: Pupils are equal, round, and reactive to light. Conjunctivae and EOM are normal. No scleral  icterus.  Neck: Normal range of motion. Neck supple. No JVD present. No tracheal deviation present. No thyromegaly present.  Cardiovascular: Normal rate, regular rhythm and normal heart sounds.  Exam reveals no gallop and no friction rub.   No murmur heard. Respiratory: Effort normal and breath sounds normal. No respiratory distress. Tenderness: this is a 41 year old male admitted for sepsis.1. Sepsis:  GI: Soft. Bowel sounds are normal. He exhibits no distension. There is no tenderness.  Genitourinary:  Genitourinary Comments: Deferred  Musculoskeletal: Normal range of motion. He exhibits no edema.  Lymphadenopathy:    He has no cervical adenopathy.  Neurological: He is alert and oriented to person, place, and time. No cranial nerve deficit.  Skin: Skin is warm and dry. No rash noted. No erythema.  Psychiatric: He has a normal mood and affect. His behavior is normal. Judgment and thought content normal.     Assessment/Plan This is a 41 year old male admitted for sepsis. 1. Sepsis: meets criteria via tachypnea and leukocytosis. He is hemodynamically stable. Follow blood cultures for growth and sensitivities. 2. Community-acquired pneumonia: continue ceftriaxone and azithromycin. Obtain sputum sample if the patient can expectorate 3. ADHD: continue Adderall per outpatient regimen 4. Overweight: BMI is 26; encourage healthy diet. 5. DVT prophylaxis: Lovenox 6. GI prophylaxis: None The patient is a full code. Time spent on admission orders and patient care approximately 45 minutes  Harrie Foreman, MD 09/25/2016, 8:22 AM

## 2016-09-26 DIAGNOSIS — R739 Hyperglycemia, unspecified: Secondary | ICD-10-CM

## 2016-09-26 DIAGNOSIS — R531 Weakness: Secondary | ICD-10-CM

## 2016-09-26 DIAGNOSIS — J9601 Acute respiratory failure with hypoxia: Secondary | ICD-10-CM

## 2016-09-26 DIAGNOSIS — D72829 Elevated white blood cell count, unspecified: Secondary | ICD-10-CM

## 2016-09-26 DIAGNOSIS — J189 Pneumonia, unspecified organism: Secondary | ICD-10-CM

## 2016-09-26 DIAGNOSIS — E0781 Sick-euthyroid syndrome: Secondary | ICD-10-CM

## 2016-09-26 LAB — CBC
HEMATOCRIT: 31.5 % — AB (ref 40.0–52.0)
Hemoglobin: 11 g/dL — ABNORMAL LOW (ref 13.0–18.0)
MCH: 30.7 pg (ref 26.0–34.0)
MCHC: 34.9 g/dL (ref 32.0–36.0)
MCV: 87.9 fL (ref 80.0–100.0)
PLATELETS: 206 10*3/uL (ref 150–440)
RBC: 3.58 MIL/uL — ABNORMAL LOW (ref 4.40–5.90)
RDW: 13.8 % (ref 11.5–14.5)
WBC: 10.6 10*3/uL (ref 3.8–10.6)

## 2016-09-26 LAB — HIV ANTIBODY (ROUTINE TESTING W REFLEX): HIV Screen 4th Generation wRfx: NONREACTIVE

## 2016-09-26 MED ORDER — GUAIFENESIN ER 600 MG PO TB12: 600.0000 mg | ORAL_TABLET | Freq: Two times a day (BID) | ORAL | 0 refills | Status: AC

## 2016-09-26 MED ORDER — CEFDINIR 300 MG PO CAPS: 300.0000 mg | ORAL_CAPSULE | Freq: Two times a day (BID) | ORAL | 0 refills | Status: AC

## 2016-09-26 MED ORDER — PREDNISONE 10 MG (21) PO TBPK: ORAL_TABLET | ORAL | 0 refills | Status: AC

## 2016-09-26 MED ORDER — IPRATROPIUM-ALBUTEROL 20-100 MCG/ACT IN AERS: 1.0000 | INHALATION_SPRAY | Freq: Four times a day (QID) | RESPIRATORY_TRACT | 2 refills | Status: AC

## 2016-09-26 MED ORDER — AZITHROMYCIN 500 MG PO TABS: 500.0000 mg | ORAL_TABLET | Freq: Every day | ORAL | 0 refills | Status: AC

## 2016-09-26 MED ORDER — HYDROCOD POLST-CPM POLST ER 10-8 MG/5ML PO SUER: 5.0000 mL | Freq: Two times a day (BID) | ORAL | 0 refills | Status: AC

## 2016-09-26 NOTE — Progress Notes (Signed)
Patient ID: Frederick Smith, male   DOB: Jun 08, 1975, 41 y.o.   MRN: [id number]         To Whom It May Concern:       [name] was hospitalized at [facility] from 09/25/2016 through 09/26/2016. He is to return back to work at full capacity 10/05/2016. Thank you for your understanding.       Sincerely,  Katharina Caper, MD

## 2016-09-26 NOTE — Discharge Summary (Signed)
[facility] [place] at [facility]   PATIENT NAME: Frederick Smith    MR#:  [id number]  DATE OF BIRTH:  03/04/1975  DATE OF ADMISSION:  09/24/2016 ADMITTING PHYSICIAN: Arnaldo Natal, MD  DATE OF DISCHARGE: No discharge date for patient encounter.  PRIMARY CARE PHYSICIAN: Claretta Fraise, DO     ADMISSION DIAGNOSIS:  Hypoxia [R09.02] SIRS (systemic inflammatory response syndrome) (HCC) [R65.10] Generalized weakness [R53.1] Community acquired pneumonia, unspecified laterality [J18.9]  DISCHARGE DIAGNOSIS:  Active Problems:   Sepsis (HCC)   Pneumonia   Acute respiratory failure with hypoxia (HCC)   Leukocytosis   Generalized weakness   Hyperglycemia   ESS (euthyroid sick syndrome)   SECONDARY DIAGNOSIS:   Past Medical History:  Diagnosis Date  . ADHD     .pro HOSPITAL COURSE:   Patient is 41 year old Caucasian male with past medical history significant for history of ADHD, who presented to the hospital with complaints of shortness of breath, weakness, productive cough. Prevacid emergency room patient chest x-ray revealed bibasilar opacities, concerning for pneumonia. Patient's white blood cell count was also elevated to 18,000. Patient's lactic acid level was normal. Patient was admitted to the hospital, initiated on Rocephin and Zithromax and clinically improved. Blood cultures were negative 2 days. White blood cell count normalized, temperature normalized. Patient was felt to be stable to be discharged home on Cefdinir and Zithromax, to be continued for 5 more days to complete course. Patient was also felt to have COPD exacerbation, although he claims never smoked in the past, but improved on nebulizing therapy, so short tapering doses of steroids and inhaler was prescribed for him upon discharge. His oxygenation improved and his O2 sats were 97% on room air on the day of discharge at rest, to be checked on exertion. He remained minimally  tachycardic on exertion. Discussion by problem: #1. Sepsis due to pneumonia, lactic acid level was normal, blood cultures are negative so far, continue broad-spectrum antibiotic therapy with Cefdinir and Zithromax for 5 more days to complete course, follow up with primary care physician within one week after discharge  #2. Bilateral basilar pneumonia, continue Cefdinir   and Zithromax, no sputum culture was reported, blood cultures were negative for 2 days, white blood cell count has normalized #3. Generalized weakness, improved, follow closely #4. Acute respiratory failure with hypoxia, weaned off oxygen , check O2 sats on room air on exertion prior to discharge to ensure stability #5. Leukocytosis, resolved with therapy #6. Hyperglycemia, hemoglobin A1c 5.3, no diabetes #7. Abnormal thyroid function test, sick euthyroid, recheck as outpatient #8. COPD exacerbation, continue steroid taper and inhaler, may benefit from PFTs as outpatient when pneumonia is healed. DISCHARGE CONDITIONS:   Stable  CONSULTS OBTAINED:    DRUG ALLERGIES:  No Known Allergies  DISCHARGE MEDICATIONS:   Current Discharge Medication List    START taking these medications   Details  azithromycin (ZITHROMAX) 500 MG tablet Take 1 tablet (500 mg total) by mouth daily. Take 1 tablet daily for 3 days. Qty: 5 tablet, Refills: 0    cefdinir (OMNICEF) 300 MG capsule Take 1 capsule (300 mg total) by mouth 2 (two) times daily. Qty: 10 capsule, Refills: 0    chlorpheniramine-HYDROcodone (TUSSIONEX) 10-8 MG/5ML SUER Take 5 mLs by mouth every 12 (twelve) hours. Qty: 140 mL, Refills: 0    guaiFENesin (MUCINEX) 600 MG 12 hr tablet Take 1 tablet (600 mg total) by mouth 2 (two) times daily. Qty: 20 tablet, Refills: 0  Ipratropium-Albuterol (COMBIVENT RESPIMAT) 20-100 MCG/ACT AERS respimat Inhale 1 puff into the lungs every 6 (six) hours. Qty: 1 Inhaler, Refills: 2    predniSONE (STERAPRED UNI-PAK 21 TAB) 10 MG (21)  TBPK tablet Please take 6 pills in the morning on the day1, then taper by one pill daily until finished, thank you Qty: 21 tablet, Refills: 0      CONTINUE these medications which have NOT CHANGED   Details  amphetamine-dextroamphetamine (ADDERALL XR) 20 MG 24 hr capsule Take 1 capsule by mouth every morning. Refills: 0    amphetamine-dextroamphetamine (ADDERALL) 10 MG tablet Take 1 tablet by mouth daily. In the afternoon Refills: 0    Multiple Vitamin (MULTI-VITAMINS) TABS Take 1 tablet by mouth daily.    Omega-3 1000 MG CAPS Take 1 capsule by mouth daily.         DISCHARGE INSTRUCTIONS:    The patient is to follow-up with primary care physician within one week after discharge  If you experience worsening of your admission symptoms, develop shortness of breath, life threatening emergency, suicidal or homicidal thoughts you must seek medical attention immediately by calling 911 or calling your MD immediately  if symptoms less severe.  You Must read complete instructions/literature along with all the possible adverse reactions/side effects for all the Medicines you take and that have been prescribed to you. Take any new Medicines after you have completely understood and accept all the possible adverse reactions/side effects.   Please note  You were cared for by a hospitalist during your hospital stay. If you have any questions about your discharge medications or the care you received while you were in the hospital after you are discharged, you can call the unit and asked to speak with the hospitalist on call if the hospitalist that took care of you is not available. Once you are discharged, your primary care physician will handle any further medical issues. Please note that NO REFILLS for any discharge medications will be authorized once you are discharged, as it is imperative that you return to your primary care physician (or establish a relationship with a primary care physician if  you do not have one) for your aftercare needs so that they can reassess your need for medications and monitor your lab values.    Today   CHIEF COMPLAINT:   Chief Complaint  Patient presents with  . Weakness  . Generalized Body Aches    HISTORY OF PRESENT ILLNESS:     VITAL SIGNS:  Blood pressure 111/61, pulse (!) 115, temperature 98 F (36.7 C), temperature source Oral, resp. rate 18, height 5\' 5"  (1.651 m), weight 74.4 kg (164 lb), SpO2 97 %.  I/O:   Intake/Output Summary (Last 24 hours) at 09/26/16 1136 Last data filed at 09/26/16 0946  Gross per 24 hour  Intake             4040 ml  Output             1400 ml  Net             2640 ml    PHYSICAL EXAMINATION:  GENERAL:  41 y.o.-year-old patient lying in the bed with no acute distress.  EYES: Pupils equal, round, reactive to light and accommodation. No scleral icterus. Extraocular muscles intact.  HEENT: Head atraumatic, normocephalic. Oropharynx and nasopharynx clear.  NECK:  Supple, no jugular venous distention. No thyroid enlargement, no tenderness.  LUNGS: Normal breath sounds bilaterally, no wheezing, rales,rhonchi or crepitation. No use of  accessory muscles of respiration.  CARDIOVASCULAR: S1, S2 normal. No murmurs, rubs, or gallops.  ABDOMEN: Soft, non-tender, non-distended. Bowel sounds present. No organomegaly or mass.  EXTREMITIES: No pedal edema, cyanosis, or clubbing.  NEUROLOGIC: Cranial nerves II through XII are intact. Muscle strength 5/5 in all extremities. Sensation intact. Gait not checked.  PSYCHIATRIC: The patient is alert and oriented x 3.  SKIN: No obvious rash, lesion, or ulcer.   DATA REVIEW:   CBC  Recent Labs Lab 09/26/16 0354  WBC 10.6  HGB 11.0*  HCT 31.5*  PLT 206    Chemistries   Recent Labs Lab 09/24/16 2222  NA 138  K 4.0  CL 108  CO2 24  GLUCOSE 114*  BUN 14  CREATININE 0.95  CALCIUM 8.9    Cardiac Enzymes  Recent Labs Lab 09/24/16 2222  TROPONINI <0.03     Microbiology Results  Results for orders placed or performed during the hospital encounter of 09/24/16  Culture, blood (routine x 2)     Status: None (Preliminary result)   Collection Time: 09/24/16 11:05 PM  Result Value Ref Range Status   Specimen Description BLOOD RIGHT ANTECUBITAL  Final   Special Requests   Final    BOTTLES DRAWN AEROBIC AND ANAEROBIC Blood Culture adequate volume   Culture NO GROWTH 2 DAYS  Final   Report Status PENDING  Incomplete  Culture, blood (routine x 2)     Status: None (Preliminary result)   Collection Time: 09/24/16 11:05 PM  Result Value Ref Range Status   Specimen Description BLOOD LEFT ANTECUBITAL  Final   Special Requests   Final    BOTTLES DRAWN AEROBIC AND ANAEROBIC Blood Culture adequate volume   Culture NO GROWTH 2 DAYS  Final   Report Status PENDING  Incomplete    RADIOLOGY:  Dg Chest Port 1 View  Result Date: 09/24/2016 CLINICAL DATA:  Cough and chills. EXAM: PORTABLE CHEST 1 VIEW COMPARISON:  None. FINDINGS: Low lung volumes. There are patchy bibasilar opacities, left greater than right. Heart size is normal for technique. Questionable blunting of right costophrenic angle. No pneumothorax. No acute osseous abnormality is seen. IMPRESSION: Low lung volumes. Patchy bibasilar opacities, left greater than right, may be pneumonia or atelectasis. Aspiration could have this appearance in the appropriate clinical setting. Electronically Signed   By: Rubye Oaks M.D.   On: 09/24/2016 23:42    EKG:   Orders placed or performed during the hospital encounter of 09/24/16  . ED EKG  . ED EKG  . EKG 12-Lead  . EKG 12-Lead      Management plans discussed with the patient, family and they are in agreement.  CODE STATUS:     Code Status Orders        Start     Ordered   09/25/16 0338  Full code  Continuous     09/25/16 0338    Code Status History    Date Active Date Inactive Code Status Order ID Comments User Context   This  patient has a current code status but no historical code status.      TOTAL TIME TAKING CARE OF THIS PATIENT: 40 minutes.    Katharina Caper M.D on 09/26/2016 at 11:36 AM  Between 7am to 6pm - Pager - [phone]  After 6pm go to [email or web address] - password [email or web address] [email or web address]  [place] [facility]  Office  [phone]  CC: Primary care physician; Claretta Fraise, DO

## 2016-09-26 NOTE — Progress Notes (Signed)
Pt has been discharged home. Discharge papers given and explained to pt, verbalized understanding.  Meds and f/u appointment reviewed with pt. RX given. Work note given. Escorted in a wheelchair.

## 2016-09-29 LAB — CULTURE, BLOOD (ROUTINE X 2)
CULTURE: NO GROWTH
Culture: NO GROWTH
Special Requests: ADEQUATE
Special Requests: ADEQUATE
